# Patient Record
Sex: Female | Born: 1955 | Hispanic: No | Smoking: Never smoker
Health system: Southern US, Community
[De-identification: ages and names within clinical notes are randomized; demographics above are authoritative.]

## PROBLEM LIST (undated history)

## (undated) DIAGNOSIS — G4730 Sleep apnea, unspecified: Secondary | ICD-10-CM

## (undated) HISTORY — DX: Sleep apnea, unspecified: G47.30

## (undated) HISTORY — PX: BILATERAL SALPINGOOPHORECTOMY: SHX1223

## (undated) HISTORY — PX: BREAST REDUCTION SURGERY: SHX8

## (undated) HISTORY — PX: CHOLECYSTECTOMY: SHX55

---

## 2021-07-30 ENCOUNTER — Other Ambulatory Visit: Payer: Self-pay

## 2021-07-30 ENCOUNTER — Ambulatory Visit (INDEPENDENT_AMBULATORY_CARE_PROVIDER_SITE_OTHER): Payer: Medicare Other | Admitting: Sports Medicine

## 2021-07-30 ENCOUNTER — Ambulatory Visit (INDEPENDENT_AMBULATORY_CARE_PROVIDER_SITE_OTHER): Payer: Medicare Other

## 2021-07-30 DIAGNOSIS — M160 Bilateral primary osteoarthritis of hip: Secondary | ICD-10-CM | POA: Diagnosis not present

## 2021-07-30 DIAGNOSIS — M170 Bilateral primary osteoarthritis of knee: Secondary | ICD-10-CM | POA: Diagnosis not present

## 2021-07-30 DIAGNOSIS — R1013 Epigastric pain: Secondary | ICD-10-CM | POA: Diagnosis not present

## 2021-07-30 MED ORDER — CELECOXIB 200 MG PO CAPS: ORAL_CAPSULE | ORAL | 2 refills | Status: DC

## 2021-07-30 NOTE — Progress Notes (Signed)
    Procedures performed today:    None.  Independent interpretation of notes and tests performed by another provider:   None.  Brief History, Exam, Impression, and Recommendations:    Dyspepsia Iriana does need to try some NSAIDs, we will switch her to Celebrex as she was getting some GI upset with ibuprofen, and she will continue her famotidine and pantoprazole.  Morbid obesity (HCC) Evetta does need to lose a great deal of weight to save her knees and hips, I planted the seed of bariatric surgery, she will think about it.  Primary osteoarthritis of both hips Several months of pain in both groin, reproduction of pain with internal rotation, getting some bilateral x-rays, formal PT, celecoxib as above, return to see me in 4 to 6 weeks, hip joint injections if no better.  Primary osteoarthritis of both knees As above bilateral knee osteoarthritis, pain at the medial joint line without effusion today, she has had viscosupplementation in the past without much improvement, NSAIDs minimally efficacious, adding some physical therapy, I would like her to work on some weight loss, we did discuss.  Her surgery as she needs to lose over 100 pounds. She will think about steroid injections, she has been somewhat resistant historically, but for now we will start conservatively.    ___________________________________________ Ihor Austin. Benjamin Stain, M.D., ABFM., CAQSM. Primary Care and Sports Medicine [place] [facility]  Adjunct Instructor of Family Medicine  [employer]

## 2021-07-30 NOTE — Assessment & Plan Note (Signed)
Tracie Contreras does need to try some NSAIDs, we will switch her to Celebrex as she was getting some GI upset with ibuprofen, and she will continue her famotidine and pantoprazole.

## 2021-07-30 NOTE — Assessment & Plan Note (Signed)
Several months of pain in both groin, reproduction of pain with internal rotation, getting some bilateral x-rays, formal PT, celecoxib as above, return to see me in 4 to 6 weeks, hip joint injections if no better.

## 2021-07-30 NOTE — Assessment & Plan Note (Signed)
Tracie Contreras does need to lose a great deal of weight to save her knees and hips, I planted the seed of bariatric surgery, she will think about it.

## 2021-07-30 NOTE — Assessment & Plan Note (Signed)
As above bilateral knee osteoarthritis, pain at the medial joint line without effusion today, she has had viscosupplementation in the past without much improvement, NSAIDs minimally efficacious, adding some physical therapy, I would like her to work on some weight loss, we did discuss.  Her surgery as she needs to lose over 100 pounds. She will think about steroid injections, she has been somewhat resistant historically, but for now we will start conservatively.

## 2021-08-08 ENCOUNTER — Ambulatory Visit: Payer: Medicare Other | Admitting: Rehabilitative and Restorative Service Providers"

## 2021-09-10 ENCOUNTER — Other Ambulatory Visit: Payer: Self-pay

## 2021-09-10 ENCOUNTER — Ambulatory Visit (INDEPENDENT_AMBULATORY_CARE_PROVIDER_SITE_OTHER): Payer: Medicare Other

## 2021-09-10 ENCOUNTER — Ambulatory Visit (INDEPENDENT_AMBULATORY_CARE_PROVIDER_SITE_OTHER): Payer: Medicare Other | Admitting: Sports Medicine

## 2021-09-10 DIAGNOSIS — G8929 Other chronic pain: Secondary | ICD-10-CM

## 2021-09-10 DIAGNOSIS — M545 Low back pain, unspecified: Secondary | ICD-10-CM | POA: Diagnosis not present

## 2021-09-10 DIAGNOSIS — M25511 Pain in right shoulder: Secondary | ICD-10-CM | POA: Insufficient documentation

## 2021-09-10 NOTE — Assessment & Plan Note (Signed)
Chronic right-sided axial low back pain. Adding x-rays, physical therapy, we will try to see if [facility] can do it, if they cannot, she will call her insurance company and find out who their preferred location is.

## 2021-09-10 NOTE — Assessment & Plan Note (Signed)
Occurred acutely as she fell and tried to grab an object. Pain over the deltoid, worse with abduction, she does have good motion and strength. Adding some physical therapy for this, x-rays, return to see me in 6 weeks, injection if no better.  In addition we discussed injections for her knees and hips, I really think we need to get her through some physical therapy first.

## 2021-09-10 NOTE — Progress Notes (Signed)
    Procedures performed today:    None.  Independent interpretation of notes and tests performed by another provider:   None.  Brief History, Exam, Impression, and Recommendations:    Chronic low back pain Chronic right-sided axial low back pain. Adding x-rays, physical therapy, we will try to see if [facility] can do it, if they cannot, she will call her insurance company and find out who their preferred location is.  Right shoulder pain Occurred acutely as she fell and tried to grab an object. Pain over the deltoid, worse with abduction, she does have good motion and strength. Adding some physical therapy for this, x-rays, return to see me in 6 weeks, injection if no better.  In addition we discussed injections for her knees and hips, I really think we need to get her through some physical therapy first.    ___________________________________________ Ihor Austin. Benjamin Stain, M.D., ABFM., CAQSM. Primary Care and Sports Medicine [place] [facility]  Adjunct Instructor of Family Medicine  [employer]

## 2021-09-13 ENCOUNTER — Telehealth: Payer: Self-pay

## 2021-09-13 DIAGNOSIS — G8929 Other chronic pain: Secondary | ICD-10-CM

## 2021-09-13 MED ORDER — PREDNISONE 50 MG PO TABS: ORAL_TABLET | ORAL | 0 refills | Status: DC

## 2021-09-13 NOTE — Telephone Encounter (Signed)
We will do a burst of prednisone to get things calm down until she can get in with physical therapy which is what will really help her pain.

## 2021-09-13 NOTE — Telephone Encounter (Signed)
Tracie Contreras states the pain in her lower back. She states the Celebrex is not helping with this pain. She is requesting something for the pain. Please advise.

## 2021-09-13 NOTE — Telephone Encounter (Signed)
Left message advising of recommendations.  

## 2021-10-22 ENCOUNTER — Ambulatory Visit (INDEPENDENT_AMBULATORY_CARE_PROVIDER_SITE_OTHER): Payer: Medicare Other | Admitting: Sports Medicine

## 2021-10-22 ENCOUNTER — Other Ambulatory Visit: Payer: Self-pay

## 2021-10-22 DIAGNOSIS — G8929 Other chronic pain: Secondary | ICD-10-CM | POA: Diagnosis not present

## 2021-10-22 DIAGNOSIS — M25511 Pain in right shoulder: Secondary | ICD-10-CM

## 2021-10-22 DIAGNOSIS — M545 Low back pain, unspecified: Secondary | ICD-10-CM

## 2021-10-22 NOTE — Assessment & Plan Note (Signed)
L4-L5 spondylolisthesis with axial low back pain, right-sided, much better with physical therapy.

## 2021-10-22 NOTE — Progress Notes (Signed)
    Procedures performed today:    None.  Independent interpretation of notes and tests performed by another provider:   None.  Brief History, Exam, Impression, and Recommendations:    Right shoulder pain Impingement type symptoms have improved considerably after physical therapy, she still has some difficulty with internal rotation, still working with physical therapy, but if she plateaus then we will consider injections.  Chronic low back pain L4-L5 spondylolisthesis with axial low back pain, right-sided, much better with physical therapy.    ___________________________________________ Ihor Austin. Benjamin Stain, M.D., ABFM., CAQSM. Primary Care and Sports Medicine [place] [facility]  Adjunct Instructor of Family Medicine  [employer]

## 2021-10-22 NOTE — Assessment & Plan Note (Signed)
Impingement type symptoms have improved considerably after physical therapy, she still has some difficulty with internal rotation, still working with physical therapy, but if she plateaus then we will consider injections.

## 2021-12-04 ENCOUNTER — Other Ambulatory Visit: Payer: Self-pay

## 2021-12-04 DIAGNOSIS — R1013 Epigastric pain: Secondary | ICD-10-CM

## 2021-12-04 MED ORDER — CELECOXIB 200 MG PO CAPS: ORAL_CAPSULE | ORAL | 2 refills | Status: AC

## 2022-12-23 ENCOUNTER — Ambulatory Visit: Payer: Medicare Other | Admitting: Sports Medicine

## 2022-12-23 DIAGNOSIS — M170 Bilateral primary osteoarthritis of knee: Secondary | ICD-10-CM

## 2022-12-23 MED ORDER — ACETAMINOPHEN ER 650 MG PO TBCR: 650.0000 mg | EXTENDED_RELEASE_TABLET | Freq: Three times a day (TID) | ORAL | 3 refills | Status: AC | PRN

## 2022-12-23 NOTE — Assessment & Plan Note (Signed)
Pleasant 67 year old female, she does have morbid obesity, she has bilateral knee osteoarthritis, x-ray confirmed. She tells me she has had viscosupplementation in the past without efficacy, she has had steroid injections in the past without efficacy, currently doing Celebrex 200 mg twice a day without sufficient efficacy. She declines tramadol, declines hydrocodone, she declines physical therapy. She is taking extra strength Tylenol so we can bump this up to arthritis.. As she has failed majority of other modalities I think she is ready for knee replacement, her body mass index is in the low 40s, she understands it needs to be below 40 to consider a knee arthroplasty, I will go ahead and refer her to the orthopedic surgeon of her choice, Dr. Ennis Forts, I would also like her to work with her PCP and weight loss provider on additional aggressive weight loss, she would likely be a candidate for GLP-1 treatment such as Zepbound and I think this could be really beneficial for her. She can return to see me on an as-needed basis.

## 2022-12-23 NOTE — Progress Notes (Signed)
    Procedures performed today:    None.  Independent interpretation of notes and tests performed by another provider:   None.  Brief History, Exam, Impression, and Recommendations:    Primary osteoarthritis of both knees Pleasant 67 year old female, she does have morbid obesity, she has bilateral knee osteoarthritis, x-ray confirmed. She tells me she has had viscosupplementation in the past without efficacy, she has had steroid injections in the past without efficacy, currently doing Celebrex 200 mg twice a day without sufficient efficacy. She declines tramadol, declines hydrocodone, she declines physical therapy. She is taking extra strength Tylenol so we can bump this up to arthritis.. As she has failed majority of other modalities I think she is ready for knee replacement, her body mass index is in the low 40s, she understands it needs to be below 40 to consider a knee arthroplasty, I will go ahead and refer her to the orthopedic surgeon of her choice, Dr. Ennis Forts, I would also like her to work with her PCP and weight loss provider on additional aggressive weight loss, she would likely be a candidate for GLP-1 treatment such as Zepbound and I think this could be really beneficial for her. She can return to see me on an as-needed basis.    ____________________________________________ Gwen Her. Dianah Field, M.D., ABFM., CAQSM., AME. Primary Care and Sports Medicine [place] [facility]  Adjunct Professor of [employer] of Medicine  Risk manager

## 2023-04-15 IMAGING — DX DG HIP (WITH OR WITHOUT PELVIS) 2-3V*L*
3 series · 3 of 3 positions shown · non-contrast
Comparison: None.

CLINICAL DATA: Pain in the groin

EXAM:
DG HIP (WITH OR WITHOUT PELVIS) 2-3V LEFT

[pelvis ap]
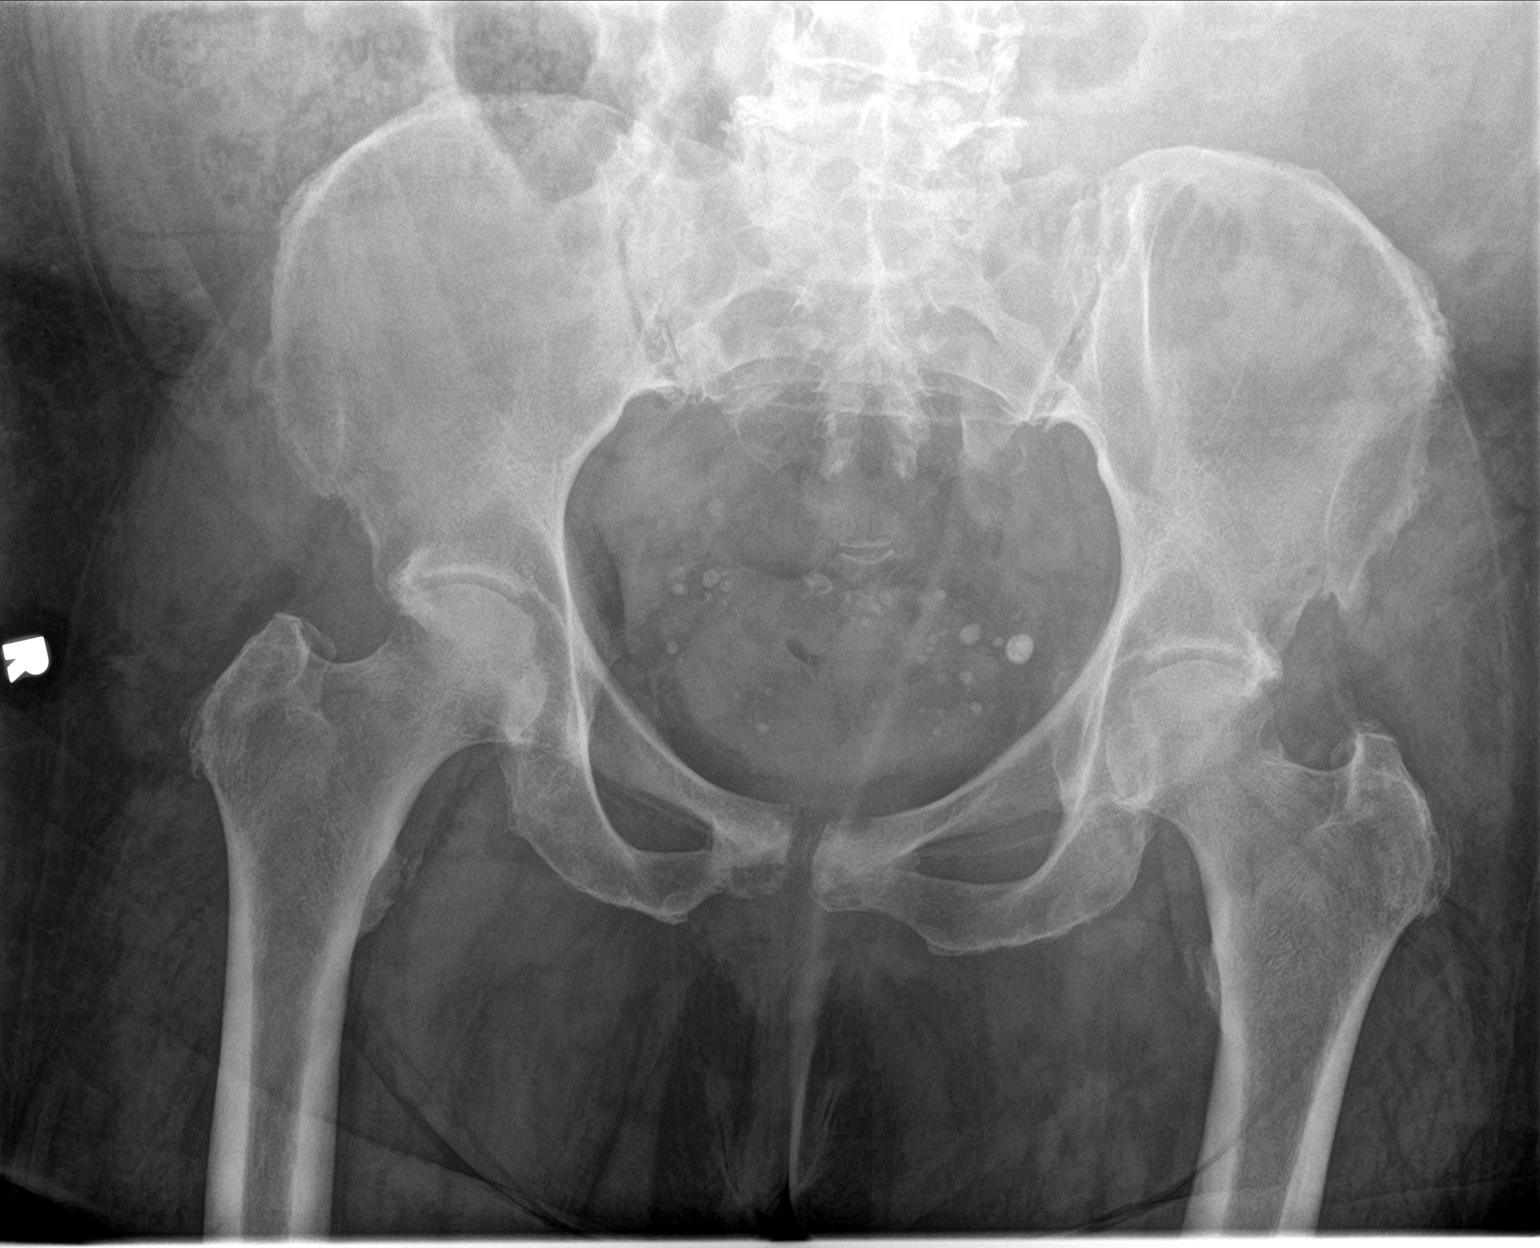

[hip ap]
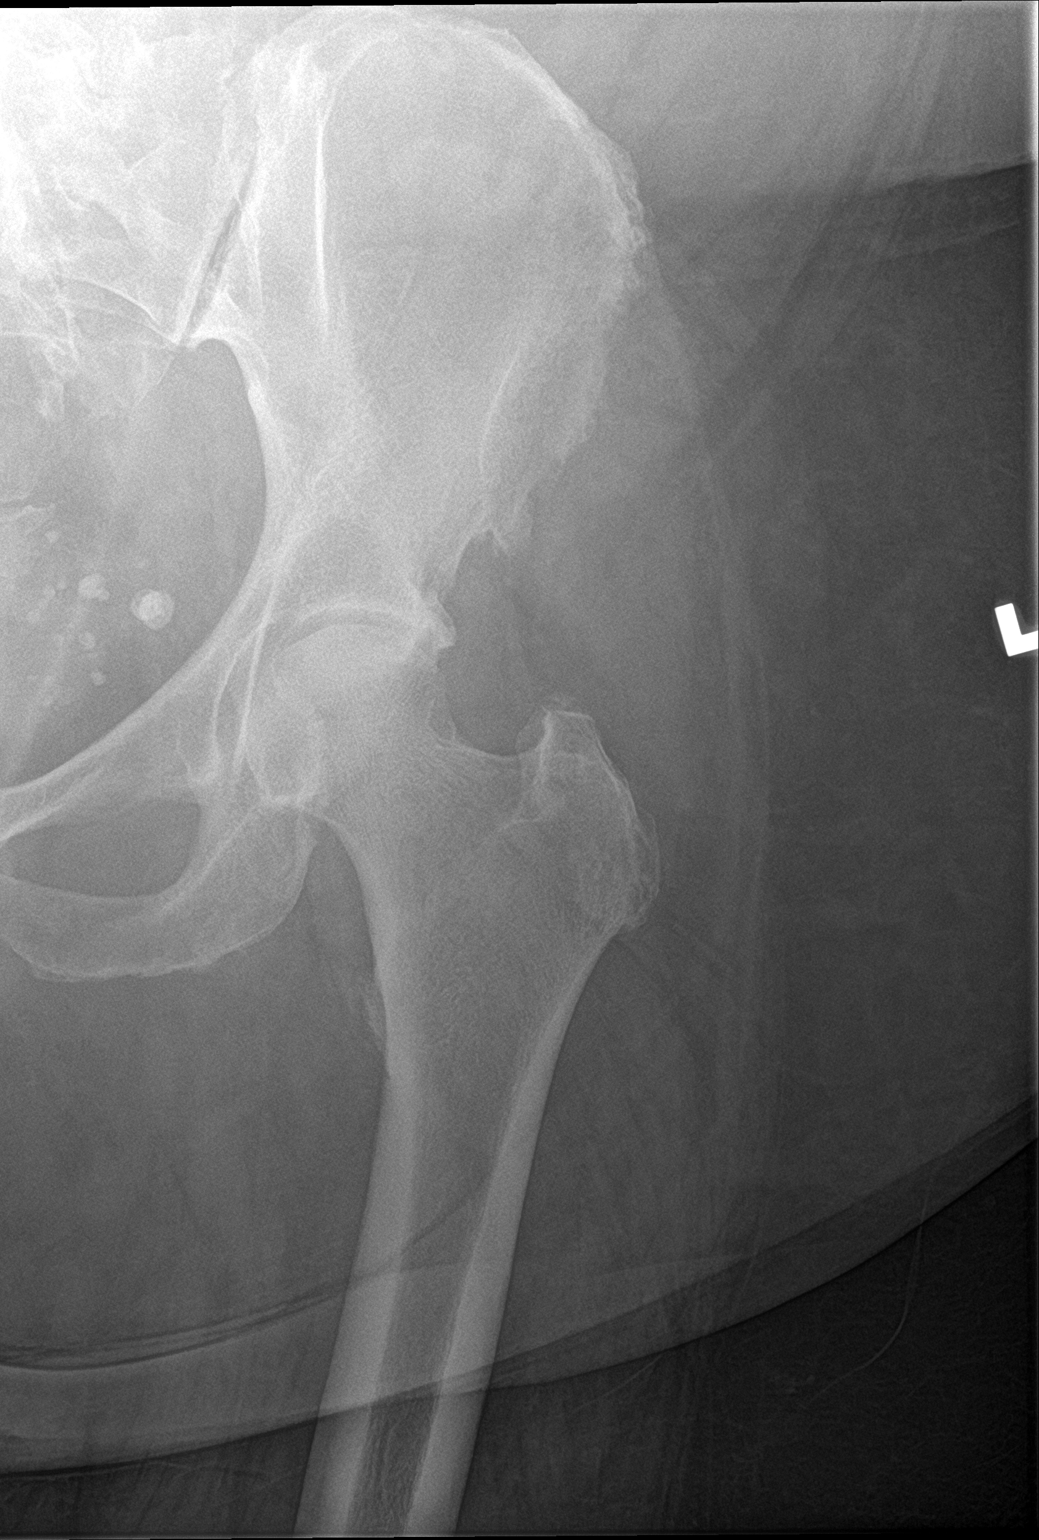

[hip lat]
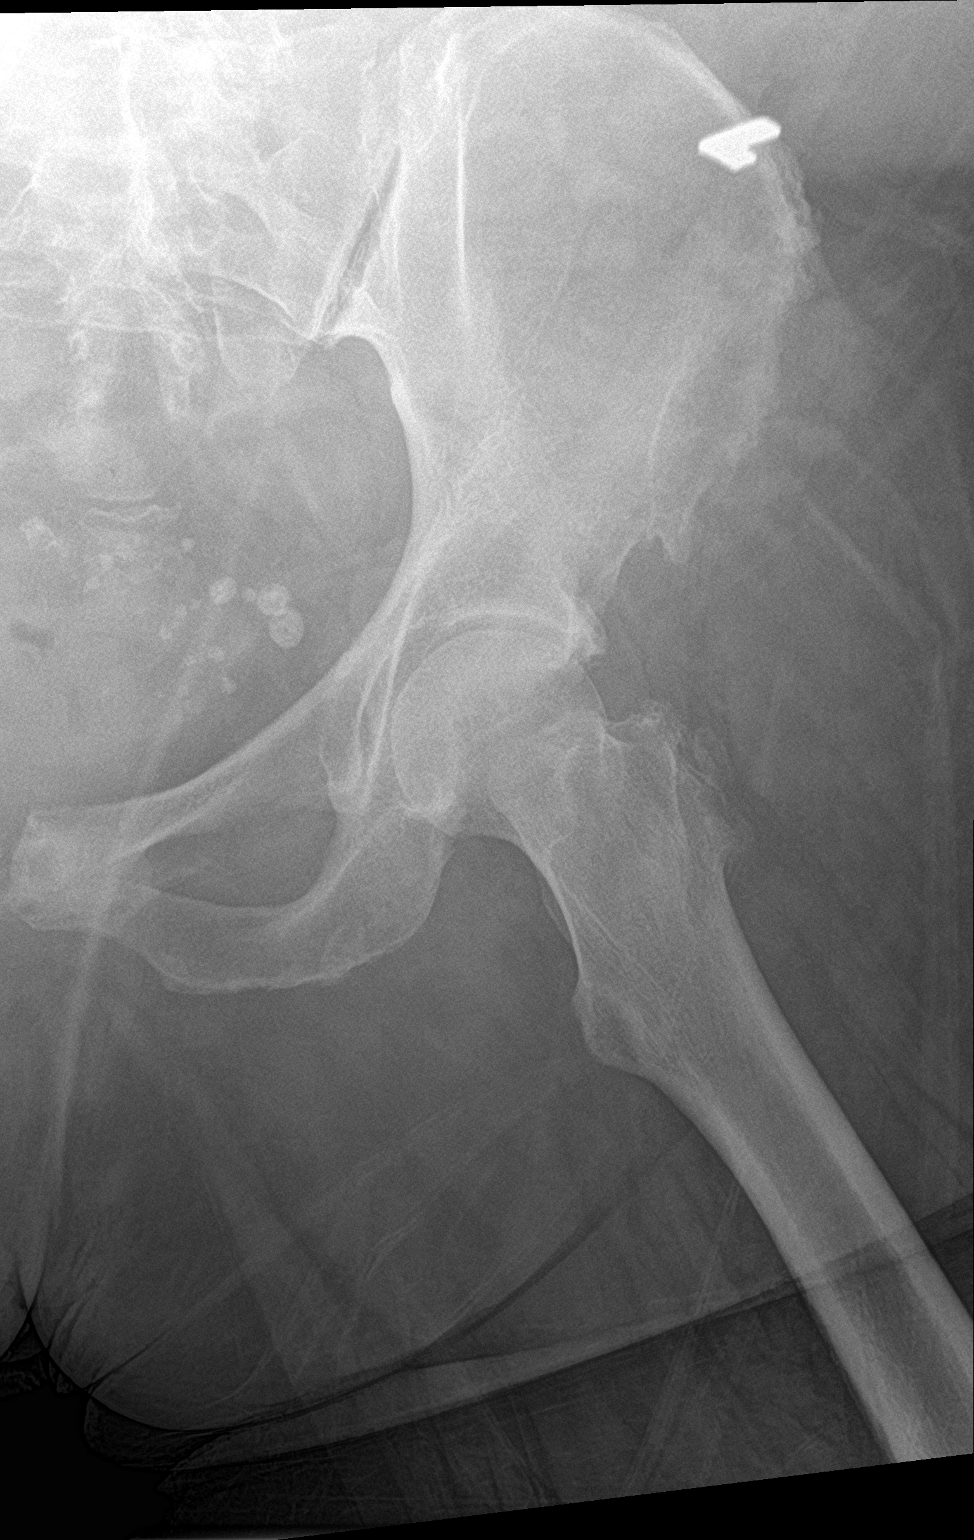

[3 of 3 positions shown; findings below may reference images not displayed]

FINDINGS: SI joints are non widened. Pubic symphysis and rami appear intact.
No fracture or malalignment. Minimal degenerative change.
IMPRESSION: Minimal degenerative change.

## 2023-04-15 IMAGING — DX DG KNEE COMPLETE 4+V*L*
4 series · 4 of 4 positions shown · non-contrast
Comparison: None.

CLINICAL DATA: Bilateral knee pain

EXAM:
LEFT KNEE - COMPLETE 4+ VIEW

[knee tunnel]
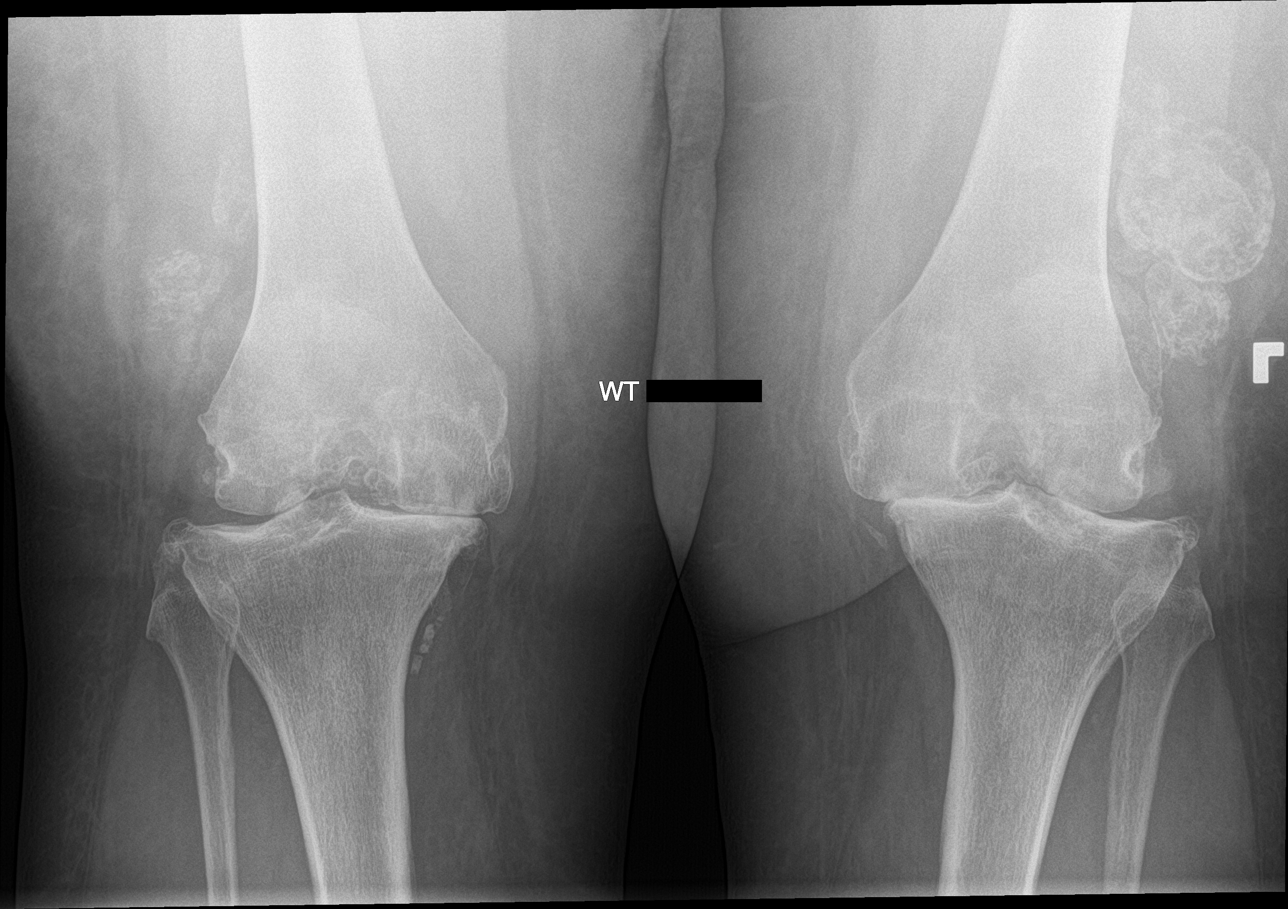

[knee lat]
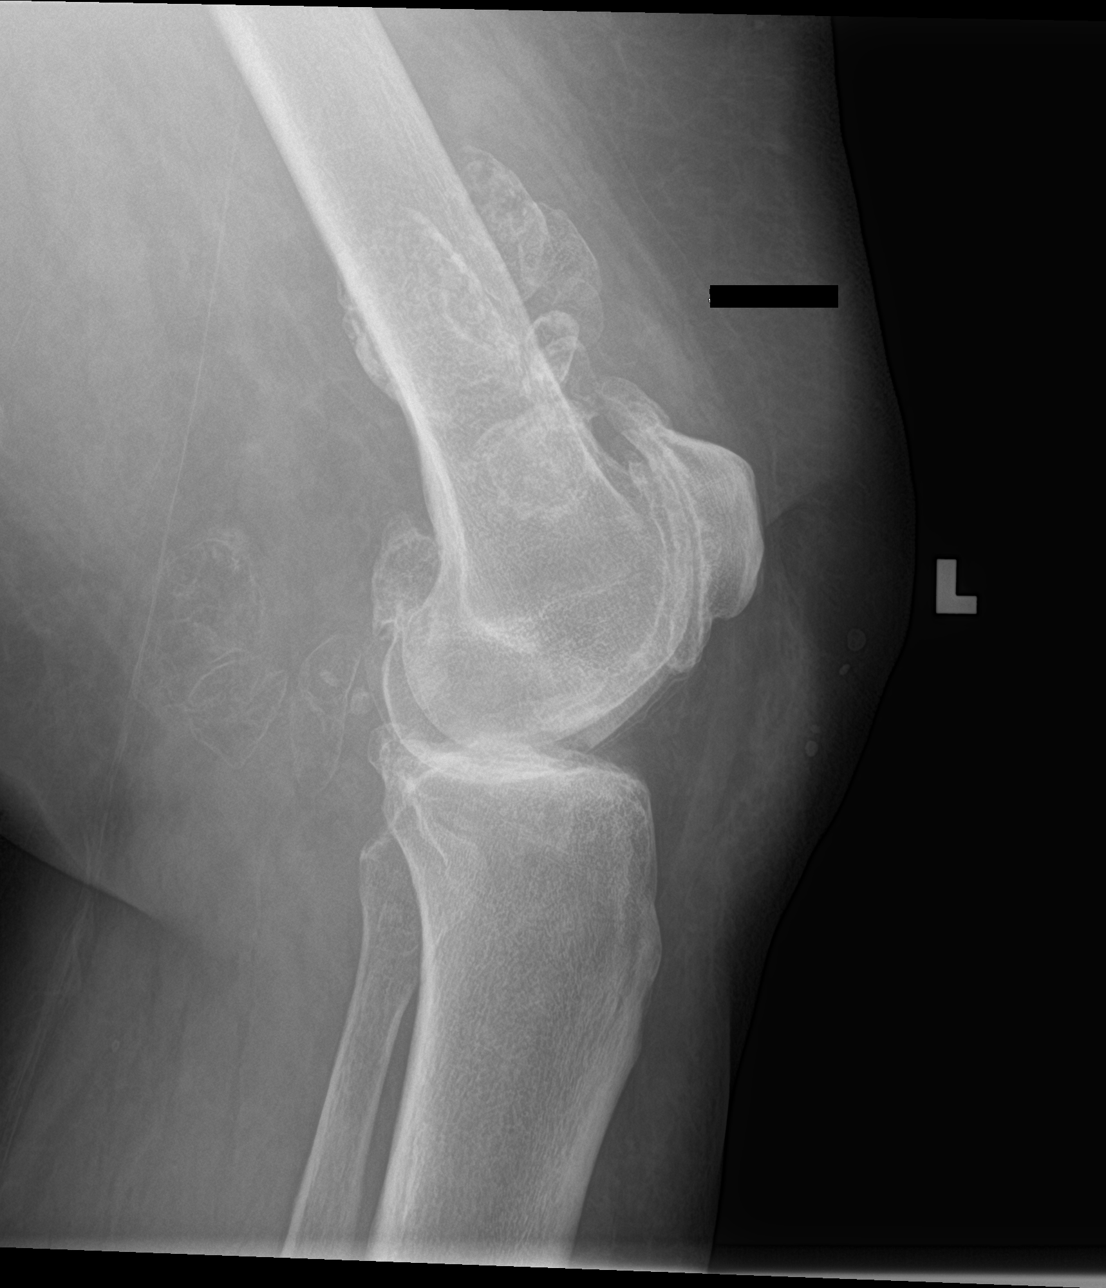

[knee sunrise]
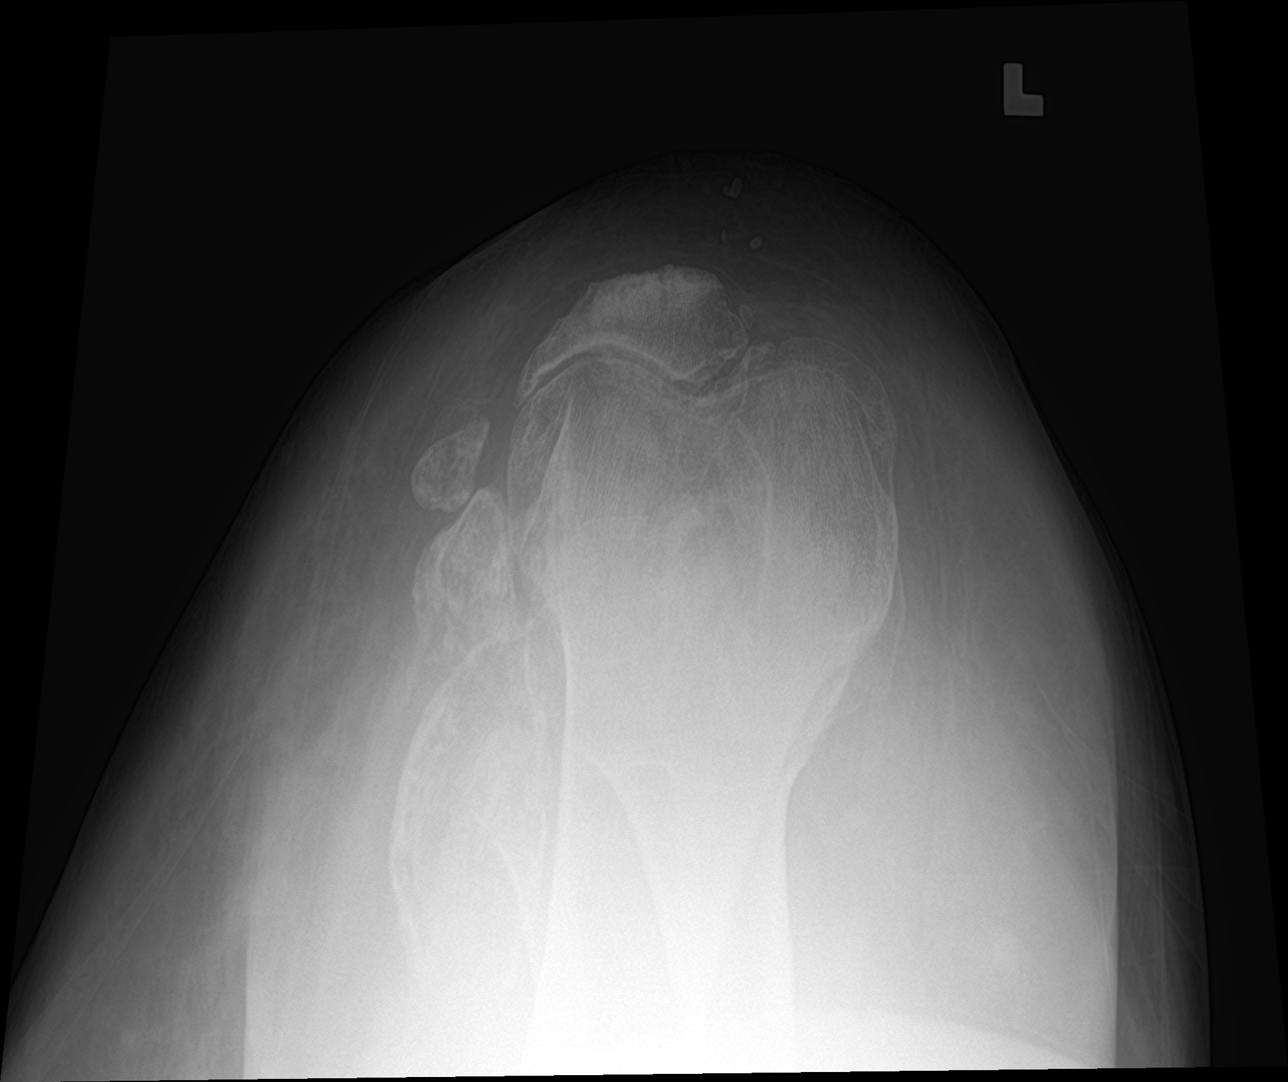

[knee ap bilat standing]
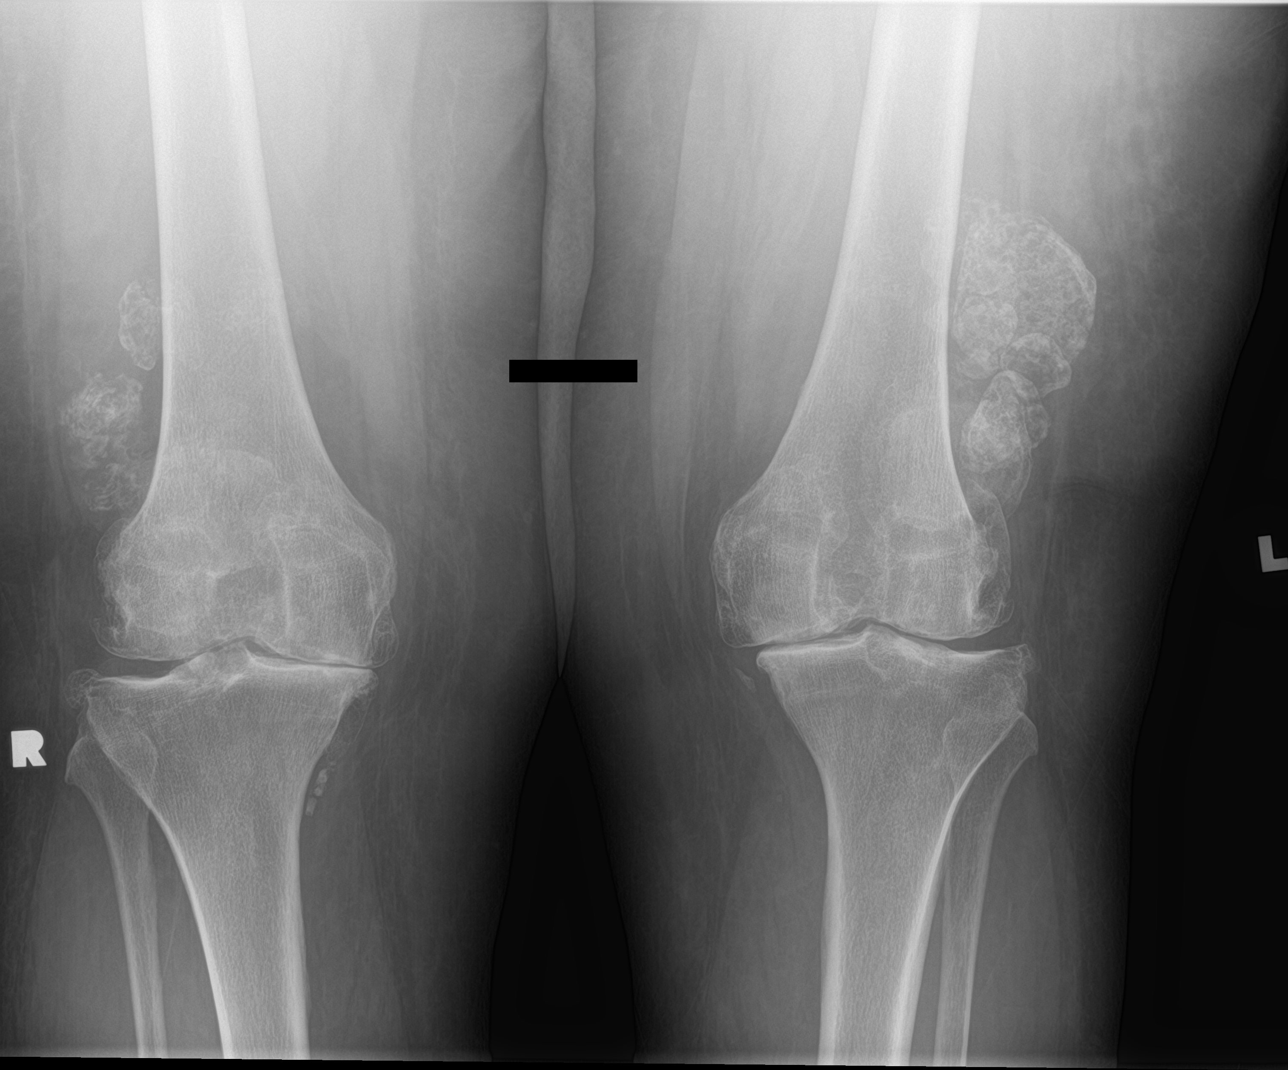

[4 of 4 positions shown; findings below may reference images not displayed]

FINDINGS: No fracture is seen. Mild lateral subluxation of the tibia with
respect to distal femur. Severe tricompartment arthritis with knee
effusion. Multiple large calcified loose bodies about the knee
measuring up to 5.2 cm
IMPRESSION: 1. Severe tricompartment arthritis with knee effusion
2. Multiple large calcified loose bodies

## 2024-04-22 ENCOUNTER — Encounter: Payer: Self-pay | Admitting: Sports Medicine

## 2024-04-22 ENCOUNTER — Ambulatory Visit: Admitting: Sports Medicine

## 2024-04-22 ENCOUNTER — Ambulatory Visit

## 2024-04-22 ENCOUNTER — Ambulatory Visit (INDEPENDENT_AMBULATORY_CARE_PROVIDER_SITE_OTHER)

## 2024-04-22 DIAGNOSIS — M25561 Pain in right knee: Secondary | ICD-10-CM

## 2024-04-22 DIAGNOSIS — M170 Bilateral primary osteoarthritis of knee: Secondary | ICD-10-CM

## 2024-04-22 DIAGNOSIS — M25562 Pain in left knee: Secondary | ICD-10-CM

## 2024-04-22 MED ORDER — TIRZEPATIDE 10 MG/0.5ML ~~LOC~~ SOAJ: SUBCUTANEOUS | 11 refills | Status: DC

## 2024-04-22 MED ORDER — TIRZEPATIDE 10 MG/0.5ML ~~LOC~~ SOAJ: SUBCUTANEOUS | 11 refills | Status: AC

## 2024-04-22 MED ORDER — TRIAMCINOLONE ACETONIDE 40 MG/ML IJ SUSP
80.0000 mg | Freq: Once | INTRAMUSCULAR | Status: AC
Start: 2024-04-22 — End: 2024-04-22
  Administered 2024-04-22: 80 mg via INTRAMUSCULAR

## 2024-04-22 MED ORDER — ONDANSETRON 8 MG PO TBDP
8.0000 mg | ORAL_TABLET | Freq: Three times a day (TID) | ORAL | 3 refills | Status: AC | PRN
Start: 2024-04-22 — End: ?

## 2024-04-22 NOTE — Assessment & Plan Note (Signed)
 Please see below, 2 years ago I planted the seed of bariatric surgery, she was not interested, we will add compounded tirzepatide, and she can follow this up with her PCP. Of note she does tell me her insurance plan has weight loss exclusions.

## 2024-04-22 NOTE — Assessment & Plan Note (Addendum)
 This is a very pleasant 68 year old female, I have not seen her for a couple of years, she has morbid obesity, severe end-stage bilateral knee osteoarthritis with multiple intra-articular loose bodies. She has had viscosupplementation and steroid injections in the past without efficacy, currently doing Celebrex  200 mg twice a day and a topical patch that seems to be helping to some degree. She will be starting aquatic therapy, I and agree with this. We did refer her for arthroplasty however her body mass index was too high. She was prescribed semaglutide and Saxenda by her primary care provider but was unable to tolerate these due to excessive nausea. We discussed additional modalities today. We will try compounded tirzepatide along with Zofran, if she is able to tolerate that she can follow this up with her PCP. After about 6 weeks of aquatic therapy, the oral medication, the topical and weight loss treatment if still having discomfort we will try steroid injection. If this fails we can consider viscosupplementation, if that fails we did discuss geniculate artery embolization. As above she would not be a candidate for arthroplasty until her BMI is less than 40.  Update: At the end of the visit the patient decided she did want injections, for this reason we will be billing a level 5, as the total time spent face-to-face as well as with non-face-to-face time was greater than 40 minutes and separate from the time spent performing the injection.

## 2024-04-22 NOTE — Addendum Note (Signed)
 Addended by: Gean Keels on: 04/22/2024 10:46 AM   Modules accepted: Orders, Level of Service

## 2024-04-22 NOTE — Addendum Note (Signed)
 Addended by: OLIVA-AVELLANEDA, Casondra Gasca L on: 04/22/2024 10:57 AM   Modules accepted: Orders

## 2024-04-22 NOTE — Progress Notes (Addendum)
 Procedures performed today:    Procedure: Real-time Ultrasound Guided injection of the left knee Device: Samsung HS60  Verbal informed consent obtained.  Time-out conducted.  Noted no overlying erythema, induration, or other signs of local infection.  Skin prepped in a sterile fashion.  Local anesthesia: Topical Ethyl chloride.  With sterile technique and under real time ultrasound guidance: Mild effusion noted, 1 cc Kenalog 40, 2 cc lidocaine, 2 cc bupivacaine injected easily Completed without difficulty  Advised to call if fevers/chills, erythema, induration, drainage, or persistent bleeding.  Images permanently stored and available for review in PACS.  Impression: Technically successful ultrasound guided injection.   Procedure: Real-time Ultrasound Guided injection of the right knee Device: Samsung HS60  Verbal informed consent obtained.  Time-out conducted.  Noted no overlying erythema, induration, or other signs of local infection.  Skin prepped in a sterile fashion.  Local anesthesia: Topical Ethyl chloride.  With sterile technique and under real time ultrasound guidance: Mild effusion noted, 1 cc Kenalog 40, 2 cc lidocaine, 2 cc bupivacaine injected easily Completed without difficulty  Advised to call if fevers/chills, erythema, induration, drainage, or persistent bleeding.  Images permanently stored and available for review in PACS.  Impression: Technically successful ultrasound guided injection.  Independent interpretation of notes and tests performed by another provider:   None.  Brief History, Exam, Impression, and Recommendations:    Primary osteoarthritis of both knees This is a very pleasant 68 year old female, I have not seen her for a couple of years, she has morbid obesity, severe end-stage bilateral knee osteoarthritis with multiple intra-articular loose bodies. She has had viscosupplementation and steroid injections in the past without efficacy, currently  doing Celebrex  200 mg twice a day and a topical patch that seems to be helping to some degree. She will be starting aquatic therapy, I and agree with this. We did refer her for arthroplasty however her body mass index was too high. She was prescribed semaglutide and Saxenda by her primary care provider but was unable to tolerate these due to excessive nausea. We discussed additional modalities today. We will try compounded tirzepatide along with Zofran, if she is able to tolerate that she can follow this up with her PCP. After about 6 weeks of aquatic therapy, the oral medication, the topical and weight loss treatment if still having discomfort we will try steroid injection. If this fails we can consider viscosupplementation, if that fails we did discuss geniculate artery embolization. As above she would not be a candidate for arthroplasty until her BMI is less than 40.  Update: At the end of the visit the patient decided she did want injections, for this reason we will be billing a level 5, as the total time spent face-to-face as well as with non-face-to-face time was greater than 40 minutes and separate from the time spent performing the injection.  Morbid obesity (HCC) Please see below, 2 years ago I planted the seed of bariatric surgery, she was not interested, we will add compounded tirzepatide, and she can follow this up with her PCP. Of note she does tell me her insurance plan has weight loss exclusions.  I spent 40 minutes of total time managing this patient today, this includes chart review, face to face, and non-face to face time.  This was separate from the time spent performing the above injections.  ____________________________________________ Joselyn Nicely. Sandy Crumb, M.D., ABFM., CAQSM., AME. Primary Care and Sports Medicine [place] [facility]  Adjunct Professor of [facility] Medicine  [employer]  Restaurant manager, fast food

## 2024-04-25 ENCOUNTER — Ambulatory Visit: Payer: Self-pay | Admitting: Sports Medicine

## 2024-06-03 ENCOUNTER — Ambulatory Visit: Admitting: Sports Medicine

## 2024-07-19 ENCOUNTER — Encounter: Payer: Self-pay | Admitting: Sports Medicine
